# Patient Record
Sex: Female | Born: 1970 | Race: White | Hispanic: No | Marital: Single | Smoking: Never smoker
Health system: Southern US, Community
[De-identification: ages and names within clinical notes are randomized; demographics above are authoritative.]

## PROBLEM LIST (undated history)

## (undated) DIAGNOSIS — N289 Disorder of kidney and ureter, unspecified: Secondary | ICD-10-CM

---

## 2008-11-10 ENCOUNTER — Other Ambulatory Visit: Admission: RE | Admit: 2008-11-10 | Discharge: 2008-11-10 | Payer: Self-pay | Admitting: Family Medicine

## 2010-11-06 ENCOUNTER — Other Ambulatory Visit (HOSPITAL_COMMUNITY)
Admission: RE | Admit: 2010-11-06 | Discharge: 2010-11-06 | Disposition: A | Discharge status: Home / Self Care | Payer: BC Managed Care – PPO | Source: Ambulatory Visit | Attending: Family Medicine | Admitting: Family Medicine

## 2010-11-06 DIAGNOSIS — Z124 Encounter for screening for malignant neoplasm of cervix: Secondary | ICD-10-CM | POA: Insufficient documentation

## 2010-11-06 DIAGNOSIS — Z1159 Encounter for screening for other viral diseases: Secondary | ICD-10-CM | POA: Insufficient documentation

## 2010-12-10 ENCOUNTER — Emergency Department (HOSPITAL_COMMUNITY)
Admission: EM | Admit: 2010-12-10 | Discharge: 2010-12-10 | Disposition: A | Discharge status: Home / Self Care | Payer: BC Managed Care – PPO | Attending: Emergency Medicine | Admitting: Emergency Medicine

## 2010-12-10 ENCOUNTER — Encounter (HOSPITAL_COMMUNITY): Payer: Self-pay

## 2010-12-10 ENCOUNTER — Emergency Department (HOSPITAL_COMMUNITY): Payer: BC Managed Care – PPO

## 2010-12-10 DIAGNOSIS — N200 Calculus of kidney: Secondary | ICD-10-CM | POA: Insufficient documentation

## 2010-12-10 DIAGNOSIS — R1032 Left lower quadrant pain: Secondary | ICD-10-CM | POA: Insufficient documentation

## 2010-12-10 DIAGNOSIS — R112 Nausea with vomiting, unspecified: Secondary | ICD-10-CM | POA: Insufficient documentation

## 2010-12-10 LAB — DIFFERENTIAL
Basophils Absolute: 0.1 10*3/uL (ref 0.0–0.1)
Basophils Relative: 0 % (ref 0–1)
Eosinophils Absolute: 0.6 10*3/uL (ref 0.0–0.7)
Monocytes Absolute: 1.4 10*3/uL — ABNORMAL HIGH (ref 0.1–1.0)
Neutro Abs: 6.7 10*3/uL (ref 1.7–7.7)

## 2010-12-10 LAB — URINALYSIS, ROUTINE W REFLEX MICROSCOPIC
Glucose, UA: NEGATIVE mg/dL
Ketones, ur: NEGATIVE mg/dL
Protein, ur: NEGATIVE mg/dL
pH: 5 (ref 5.0–8.0)

## 2010-12-10 LAB — CBC
Hemoglobin: 13 g/dL (ref 12.0–15.0)
MCH: 29.5 pg (ref 26.0–34.0)
MCHC: 34.4 g/dL (ref 30.0–36.0)
Platelets: 273 10*3/uL (ref 150–400)
RDW: 12.5 % (ref 11.5–15.5)

## 2010-12-10 LAB — COMPREHENSIVE METABOLIC PANEL
ALT: 8 U/L (ref 0–35)
AST: 15 U/L (ref 0–37)
Albumin: 3.8 g/dL (ref 3.5–5.2)
Alkaline Phosphatase: 46 U/L (ref 39–117)
Calcium: 9.6 mg/dL (ref 8.4–10.5)
Glucose, Bld: 135 mg/dL — ABNORMAL HIGH (ref 70–99)
Potassium: 3.5 mEq/L (ref 3.5–5.1)
Sodium: 137 mEq/L (ref 135–145)
Total Protein: 8 g/dL (ref 6.0–8.3)

## 2010-12-10 LAB — URINE MICROSCOPIC-ADD ON

## 2012-07-20 IMAGING — CT CT ABD-PELV W/O CM
2 of 4 series · 17 of 46 positions shown, 19 images · non-contrast
Comparison: None.

CLINICAL DATA: .

CT ABDOMEN AND PELVIS WITHOUT CONTRAST
TECHNIQUE: Multidetector CT imaging of the abdomen and pelvis was
performed following the standard protocol without intravenous
contrast.

[Series 2: under 200# stone no prev · axial · 0.59mm/px · z∈[-257,+73]mm · 14 of 72 slices shown, 16 images]
[im 3/72  soft-tissue]
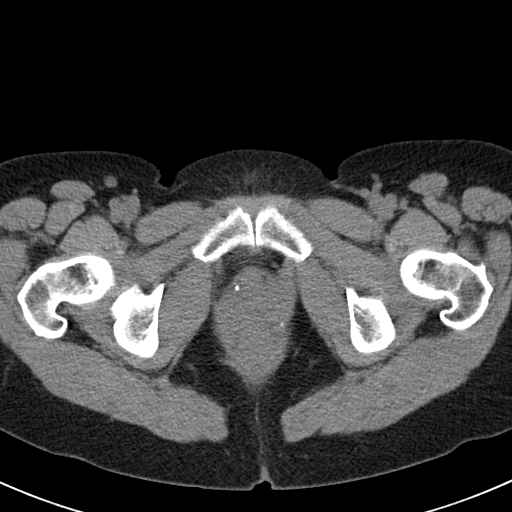
[im 3/72  bone]
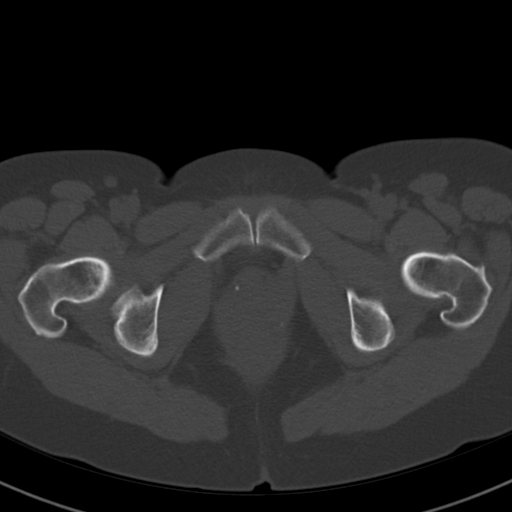
[im 9/72  soft-tissue]
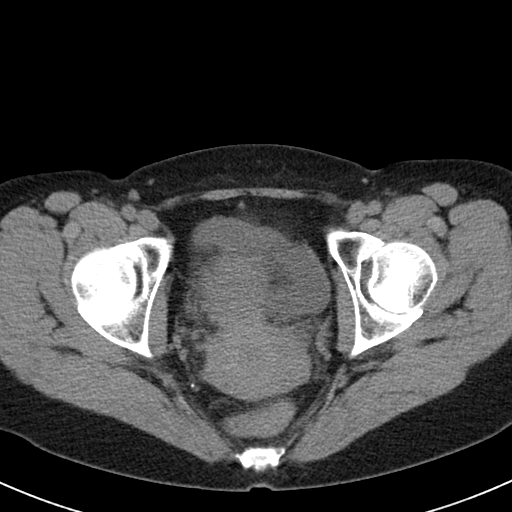
[im 15/72  soft-tissue]
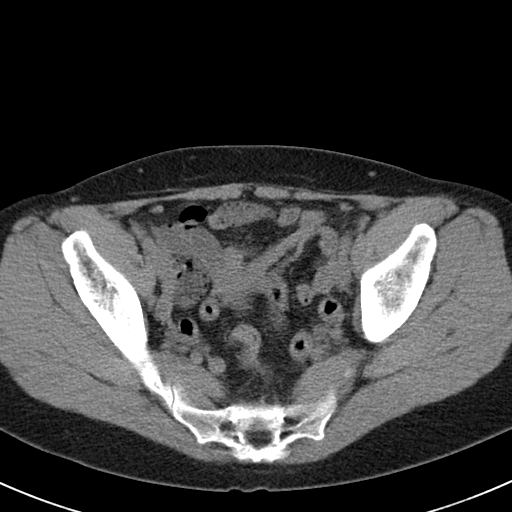
[im 20/72  soft-tissue]
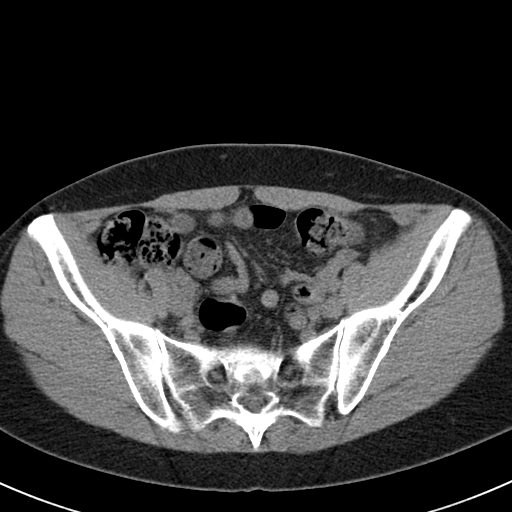
[im 23/72  soft-tissue]
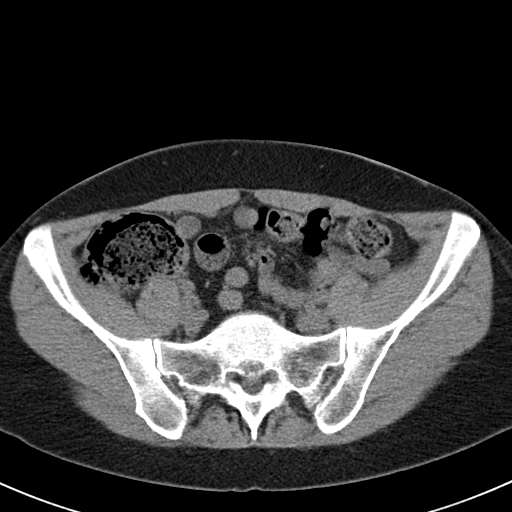
[im 29/72  soft-tissue]
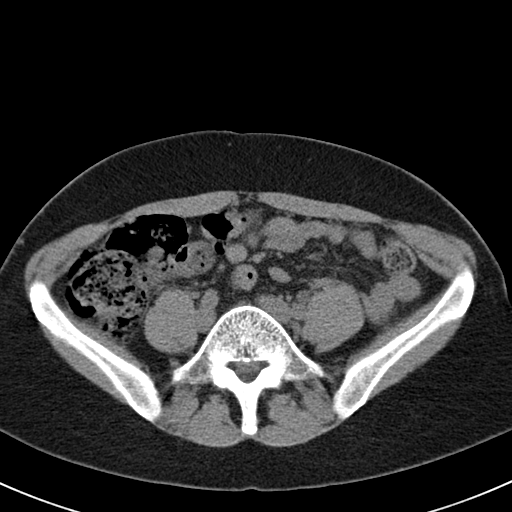
[im 35/72  soft-tissue]
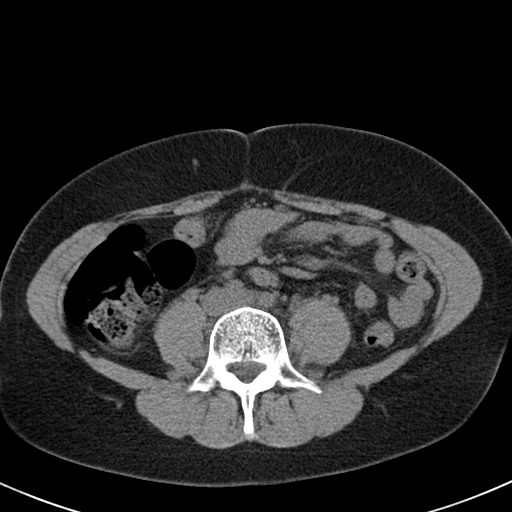
[im 37/72  soft-tissue]
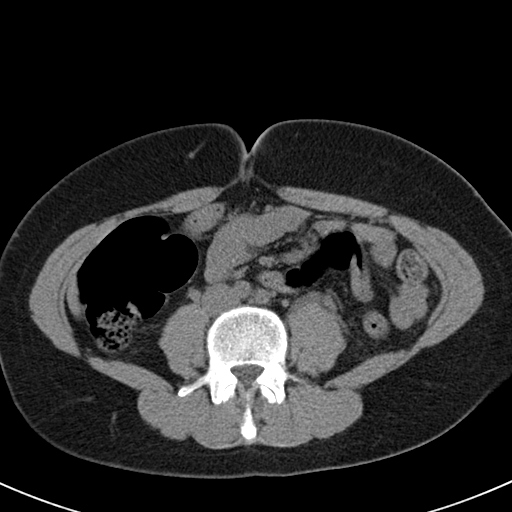
[im 43/72  soft-tissue]
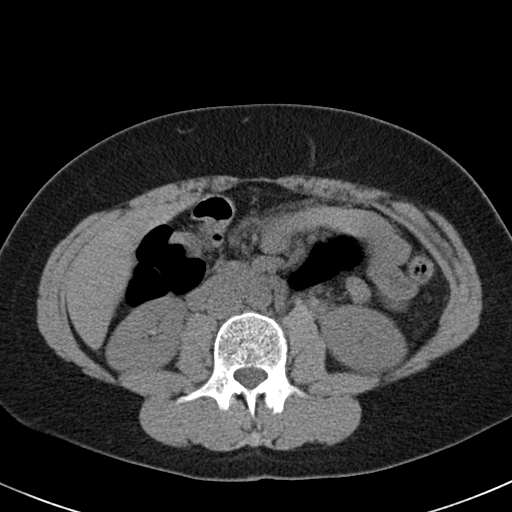
[im 43/72  bone]
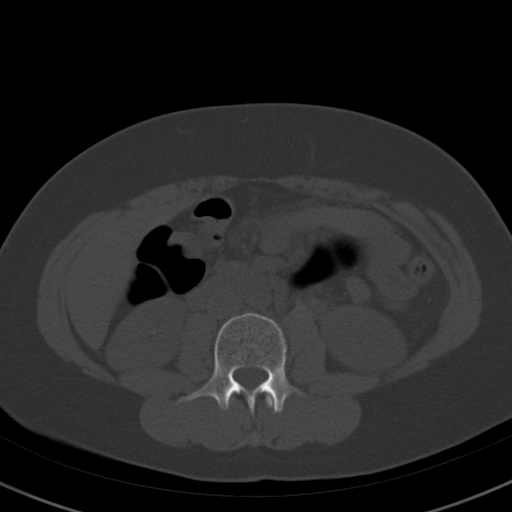
[im 49/72  soft-tissue]
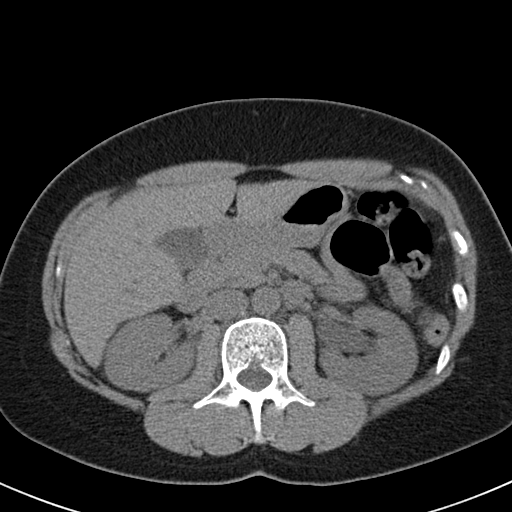
[im 54/72  soft-tissue]
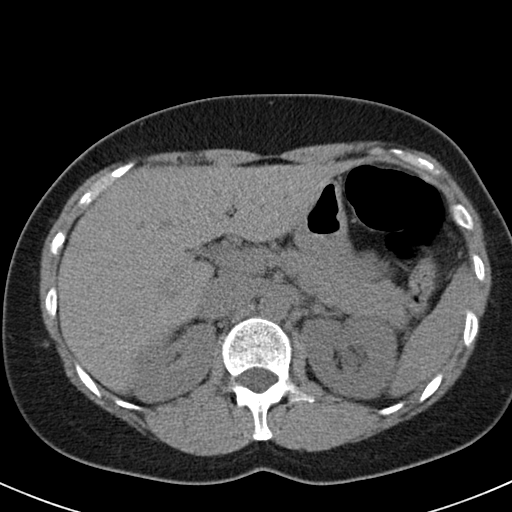
[im 57/72  soft-tissue]
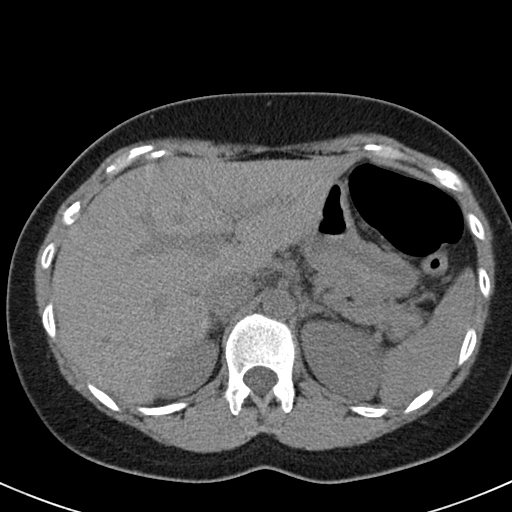
[im 63/72  soft-tissue]
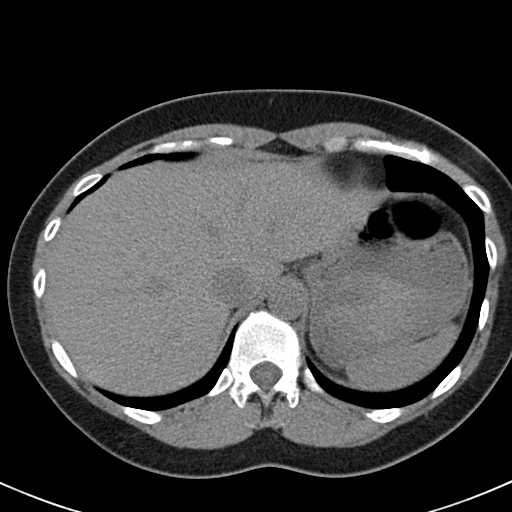
[im 69/72  soft-tissue]
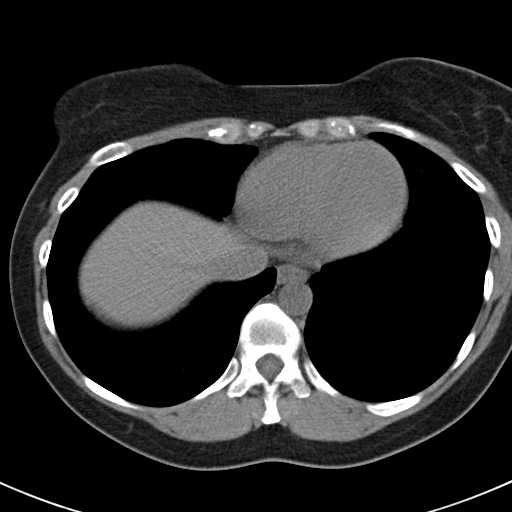

[Series 602: <mpr thick range> · coronal · 0.74mm/px · 3 of 67 slices shown]
[im 23/67  soft-tissue]
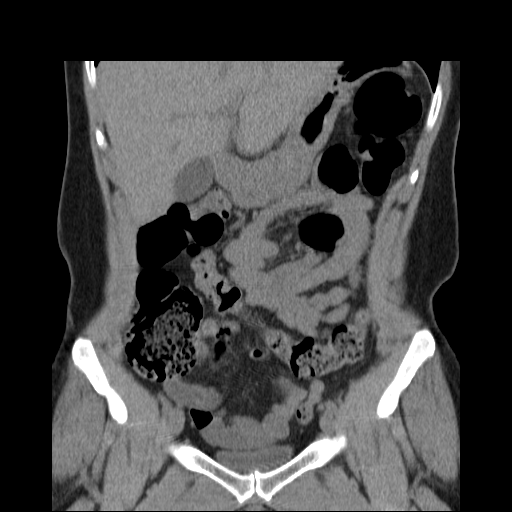
[im 30/67  soft-tissue]
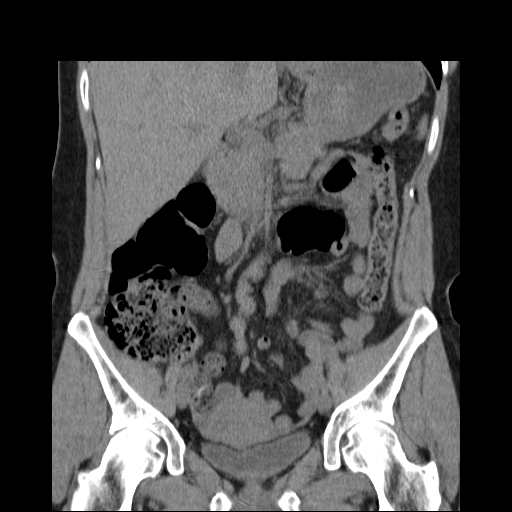
[im 37/67  soft-tissue]
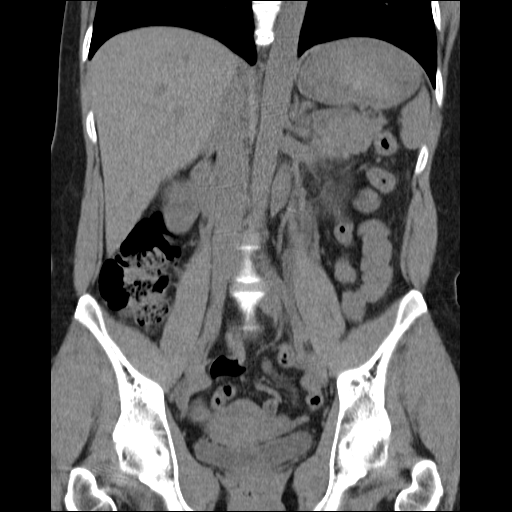

[17 of 46 positions shown; findings below may reference images not displayed]

FINDINGS: Limited images through the lung bases demonstrate no
significant appreciable abnormality. The heart size is within
normal limits. No pleural or pericardial effusion.

Unremarkable liver, biliary system, spleen, pancreas, and adrenal
glands.  There is a nonobstructing tiny right renal stone.  There
is mild left-sided hydroureteronephrosis.  A 2 mm stone is present
at the left UVJ or just distal to the left UVJ.  Otherwise the
bladder is unremarkable.

Bowel loops are of normal course and caliber

Uterus and adnexa are within normal limits.  No free fluid within
the pelvis.

No aggressive osseous lesions. L4-5 disc bulge results in central
canal narrowing.  The aorta is normal in caliber.
IMPRESSION: 2 mm stone at the left UVJ.  Mild left-sided hydroureteronephrosis.

2 mm nonobstructing interpolar right renal stone.

## 2014-01-31 ENCOUNTER — Other Ambulatory Visit: Payer: Self-pay | Admitting: Family Medicine

## 2014-01-31 ENCOUNTER — Other Ambulatory Visit (HOSPITAL_COMMUNITY)
Admission: RE | Admit: 2014-01-31 | Discharge: 2014-01-31 | Disposition: A | Discharge status: Home / Self Care | Payer: BC Managed Care – PPO | Source: Ambulatory Visit | Attending: Family Medicine | Admitting: Family Medicine

## 2014-01-31 DIAGNOSIS — Z1151 Encounter for screening for human papillomavirus (HPV): Secondary | ICD-10-CM | POA: Insufficient documentation

## 2014-01-31 DIAGNOSIS — Z01419 Encounter for gynecological examination (general) (routine) without abnormal findings: Secondary | ICD-10-CM | POA: Diagnosis present

## 2014-02-02 LAB — CYTOLOGY - PAP

## 2016-01-20 ENCOUNTER — Emergency Department (HOSPITAL_COMMUNITY)
Admission: EM | Admit: 2016-01-20 | Discharge: 2016-01-20 | Disposition: A | Discharge status: Home / Self Care | Payer: BC Managed Care – PPO | Attending: Emergency Medicine | Admitting: Emergency Medicine

## 2016-01-20 ENCOUNTER — Encounter (HOSPITAL_COMMUNITY): Payer: Self-pay | Admitting: Emergency Medicine

## 2016-01-20 ENCOUNTER — Emergency Department (HOSPITAL_COMMUNITY): Payer: BC Managed Care – PPO

## 2016-01-20 DIAGNOSIS — N200 Calculus of kidney: Secondary | ICD-10-CM | POA: Diagnosis not present

## 2016-01-20 DIAGNOSIS — Z79899 Other long term (current) drug therapy: Secondary | ICD-10-CM | POA: Diagnosis not present

## 2016-01-20 DIAGNOSIS — Z791 Long term (current) use of non-steroidal anti-inflammatories (NSAID): Secondary | ICD-10-CM | POA: Diagnosis not present

## 2016-01-20 DIAGNOSIS — R109 Unspecified abdominal pain: Secondary | ICD-10-CM | POA: Diagnosis present

## 2016-01-20 HISTORY — DX: Disorder of kidney and ureter, unspecified: N28.9

## 2016-01-20 LAB — I-STAT CHEM 8, ED
BUN: 22 mg/dL — AB (ref 6–20)
CALCIUM ION: 1.08 mmol/L — AB (ref 1.15–1.40)
Chloride: 108 mmol/L (ref 101–111)
Creatinine, Ser: 0.8 mg/dL (ref 0.44–1.00)
GLUCOSE: 132 mg/dL — AB (ref 65–99)
HCT: 43 % (ref 36.0–46.0)
Hemoglobin: 14.6 g/dL (ref 12.0–15.0)
Potassium: 3.7 mmol/L (ref 3.5–5.1)
SODIUM: 139 mmol/L (ref 135–145)
TCO2: 18 mmol/L (ref 0–100)

## 2016-01-20 LAB — URINALYSIS, ROUTINE W REFLEX MICROSCOPIC
BILIRUBIN URINE: NEGATIVE
GLUCOSE, UA: NEGATIVE mg/dL
KETONES UR: 40 mg/dL — AB
NITRITE: NEGATIVE
PH: 5.5 (ref 5.0–8.0)
Protein, ur: 30 mg/dL — AB
Specific Gravity, Urine: 1.036 — ABNORMAL HIGH (ref 1.005–1.030)

## 2016-01-20 LAB — I-STAT BETA HCG BLOOD, ED (MC, WL, AP ONLY): I-stat hCG, quantitative: <5 m[IU]/mL

## 2016-01-20 LAB — URINE MICROSCOPIC-ADD ON

## 2016-01-20 MED ORDER — HYDROCODONE-ACETAMINOPHEN 5-325 MG PO TABS: 1.0000 | ORAL_TABLET | Freq: Four times a day (QID) | ORAL | 0 refills | Status: AC | PRN

## 2016-01-20 MED ORDER — ONDANSETRON 8 MG PO TBDP
8.0000 mg | ORAL_TABLET | Freq: Once | ORAL | Status: AC
  Administered 2016-01-20: 8 mg via ORAL
  Filled 2016-01-20: qty 1

## 2016-01-20 MED ORDER — ONDANSETRON HCL 4 MG/2ML IJ SOLN
4.0000 mg | Freq: Once | INTRAMUSCULAR | Status: AC
  Administered 2016-01-20: 4 mg via INTRAVENOUS
  Filled 2016-01-20: qty 2

## 2016-01-20 MED ORDER — HYDROMORPHONE HCL 1 MG/ML IJ SOLN
1.0000 mg | Freq: Once | INTRAMUSCULAR | Status: AC
  Administered 2016-01-20: 1 mg via INTRAVENOUS
  Filled 2016-01-20: qty 1

## 2016-01-20 MED ORDER — ONDANSETRON 4 MG PO TBDP: 4.0000 mg | ORAL_TABLET | Freq: Three times a day (TID) | ORAL | 0 refills | Status: AC | PRN

## 2016-01-20 MED ORDER — KETOROLAC TROMETHAMINE 30 MG/ML IJ SOLN
15.0000 mg | Freq: Once | INTRAMUSCULAR | Status: AC
  Administered 2016-01-20: 15 mg via INTRAMUSCULAR
  Filled 2016-01-20: qty 1

## 2016-01-20 MED ORDER — TAMSULOSIN HCL 0.4 MG PO CAPS: 0.4000 mg | ORAL_CAPSULE | Freq: Every day | ORAL | 0 refills | Status: AC

## 2016-01-20 MED ORDER — SODIUM CHLORIDE 0.9 % IV BOLUS (SEPSIS)
1000.0000 mL | Freq: Once | INTRAVENOUS | Status: AC
  Administered 2016-01-20: 1000 mL via INTRAVENOUS

## 2016-01-20 NOTE — ED Triage Notes (Signed)
Pt c/o L sided abd pain onset yesterday with radiation to L flank. +n/v

## 2016-01-20 NOTE — ED Notes (Signed)
Patient given Zofran and was wheel chaired to car. Patient nausea subsided

## 2016-01-20 NOTE — ED Provider Notes (Signed)
[facility]-EMERGENCY DEPT Provider Note   CSN: [id number] Arrival date & time: 01/20/16  0402     History   Chief Complaint Chief Complaint  Patient presents with  . Abdominal Pain    HPI Jill Best is a 45 y.o. female.  HPI Patient presents with the acute onset of left-sided abdominal flank pain. Pain began within the past few hours, since onset has been persistent, severe, minimally improved with home narcotic use. There is associated nausea, vomiting, no diarrhea. No dysuria. Patient states that the pain is the same as that she experienced with prior kidney stone. She was well prior to the onset of this illness.  Past Medical History:  Diagnosis Date  . Renal disorder    kidney stones    There are no active problems to display for this patient.   Past Surgical History:  Procedure Laterality Date  . CESAREAN SECTION      OB History    No data available       Home Medications    Prior to Admission medications   Not on File    Family History No family history on file.  Social History Social History  Substance Use Topics  . Smoking status: Never Smoker  . Smokeless tobacco: Never Used  . Alcohol use No     Allergies   Review of patient's allergies indicates no known allergies.   Review of Systems Review of Systems  Constitutional:       Per HPI, otherwise negative  HENT:       Per HPI, otherwise negative  Respiratory:       Per HPI, otherwise negative  Cardiovascular:       Per HPI, otherwise negative  Gastrointestinal: Positive for abdominal pain, nausea and vomiting.  Endocrine:       Negative aside from HPI  Genitourinary:       Neg aside from HPI   Musculoskeletal:       Per HPI, otherwise negative  Skin: Negative.   Neurological: Negative for syncope.     Physical Exam Updated Vital Signs BP (!) 172/105 (BP Location: Left Arm)   Pulse 68   Temp 97.8 F (36.6 C) (Oral)   Resp 22   Ht 5\' 2"  (1.575 m)   Wt 125 lb (56.7  kg)   LMP 01/15/2016   SpO2 100%   BMI 22.86 kg/m   Physical Exam  Constitutional: She is oriented to person, place, and time. She appears well-developed and well-nourished. No distress.  Uncomfortable appearing female resting in a fetal position on her knees on the gurney  HENT:  Head: Normocephalic and atraumatic.  Eyes: Conjunctivae and EOM are normal.  Cardiovascular: Normal rate and regular rhythm.   Pulmonary/Chest: Effort normal and breath sounds normal. No stridor. No respiratory distress.  Abdominal: She exhibits no distension. There is no tenderness.  Patient describes flank pain, abdominal pain  Musculoskeletal: She exhibits no edema.  Neurological: She is alert and oriented to person, place, and time. No cranial nerve deficit.  Skin: Skin is warm and dry.  Psychiatric: She has a normal mood and affect.  Nursing note and vitals reviewed.    ED Treatments / Results  Labs (all labs ordered are listed, but only abnormal results are displayed) Labs Reviewed  URINALYSIS, ROUTINE W REFLEX MICROSCOPIC (NOT AT [facility]) - Abnormal; Notable for the following:       Result Value   APPearance CLOUDY (*)    Specific Gravity, Urine 1.036 (*)  Hgb urine dipstick LARGE (*)    Ketones, ur 40 (*)    Protein, ur 30 (*)    Leukocytes, UA SMALL (*)    All other components within normal limits  URINE MICROSCOPIC-ADD ON - Abnormal; Notable for the following:    Squamous Epithelial / LPF 6-30 (*)    Bacteria, UA FEW (*)    All other components within normal limits  I-STAT CHEM 8, ED - Abnormal; Notable for the following:    BUN 22 (*)    Glucose, Bld 132 (*)    Calcium, Ion 1.08 (*)    All other components within normal limits  I-STAT BETA HCG BLOOD, ED ([facility], [facility], [facility] ONLY)    Radiology [place] Renal  Result Date: 01/20/2016 CLINICAL DATA:  Acute onset of left flank pain, with nausea and vomiting. Initial encounter. EXAM: RENAL / URINARY TRACT ULTRASOUND COMPLETE COMPARISON:  CT of the  abdomen and pelvis from 12/10/2010 FINDINGS: Right Kidney: Length: 10.5 cm. Echogenicity within normal limits. No mass or hydronephrosis visualized. Left Kidney: Length: 12.2 cm. Echogenicity within normal limits. No mass or hydronephrosis visualized. Bladder: Decompressed and not well assessed. IMPRESSION: Unremarkable renal ultrasound. Electronically Signed   By: Roanna RaiderJeffery  Chang M.D.   On: 01/20/2016 06:03    Procedures Procedures (including critical care time)  Medications Ordered in ED Medications  ketorolac (TORADOL) 30 MG/ML injection 15 mg (15 mg Intramuscular Given 01/20/16 0444)  sodium chloride 0.9 % bolus 1,000 mL (0 mLs Intravenous Stopped 01/20/16 0644)  ondansetron (ZOFRAN) injection 4 mg (4 mg Intravenous Given 01/20/16 0609)  HYDROmorphone (DILAUDID) injection 1 mg (1 mg Intravenous Given 01/20/16 0609)   Update: Patient substantially better, lying in a supine fashion, speaking clearly, with only soreness in the left lower quadrant, no substantial pain.  We discussed results thus far, consideration of CT scan for further delineation of the stone dimensions. Patient will follow-up with urology directly.   Initial Impression / Assessment and Plan / ED Course  I have reviewed the triage vital signs and the nursing notes.  Pertinent labs & imaging results that were available during my care of the patient were reviewed by me and considered in my medical decision making (see chart for details).   Patient with history of kidney stones presents with acute onset left flank pain, has physical exam, urinalysis, consistent with kidney stone. Here, no evidence for hydronephrosis, urinary tract infection and symptoms were well-controlled with fluids, antiemetic, analgesia. Patient appropriate for discharge with outpatient urology follow-up.   Gerhard Munchobert Rosealee Recinos, MD 01/20/16 [phone]

## 2016-01-20 NOTE — ED Notes (Signed)
Patient felt nauseas going out. Got ODT zofran

## 2016-06-12 ENCOUNTER — Other Ambulatory Visit: Payer: Self-pay | Admitting: Family Medicine

## 2016-06-12 ENCOUNTER — Other Ambulatory Visit (HOSPITAL_COMMUNITY)
Admission: RE | Admit: 2016-06-12 | Discharge: 2016-06-12 | Disposition: A | Discharge status: Home / Self Care | Payer: BC Managed Care – PPO | Source: Ambulatory Visit | Attending: Family Medicine | Admitting: Family Medicine

## 2016-06-12 DIAGNOSIS — Z124 Encounter for screening for malignant neoplasm of cervix: Secondary | ICD-10-CM | POA: Insufficient documentation

## 2016-06-12 DIAGNOSIS — Z1151 Encounter for screening for human papillomavirus (HPV): Secondary | ICD-10-CM | POA: Insufficient documentation

## 2016-06-13 LAB — CYTOLOGY - PAP
DIAGNOSIS: NEGATIVE
HPV: NOT DETECTED

## 2017-12-12 IMAGING — US US RENAL
1 series · 14 of 25 positions shown · non-contrast
Comparison: CT of the abdomen and pelvis from 12/10/2010

CLINICAL DATA: Acute onset of left flank pain, with nausea and
vomiting. Initial encounter.

EXAM:
RENAL / URINARY TRACT ULTRASOUND COMPLETE

[Series 1: us renal · 0.23mm/px · 14 of 56 slices shown]
[im 1/56]
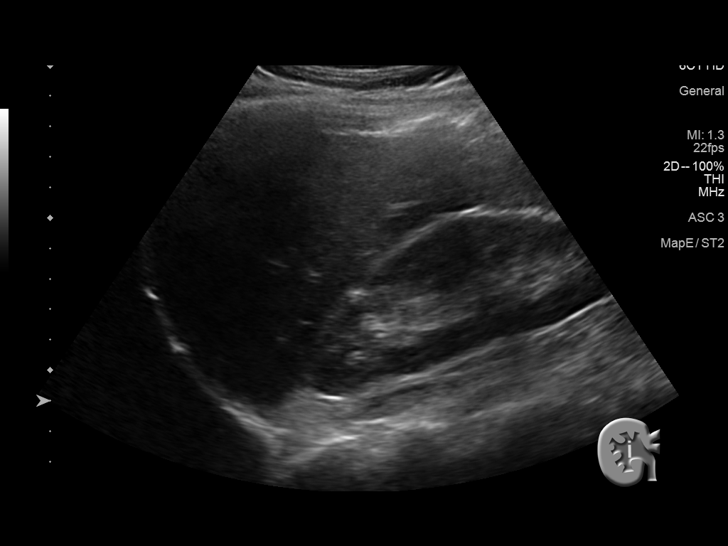
[im 5/56]
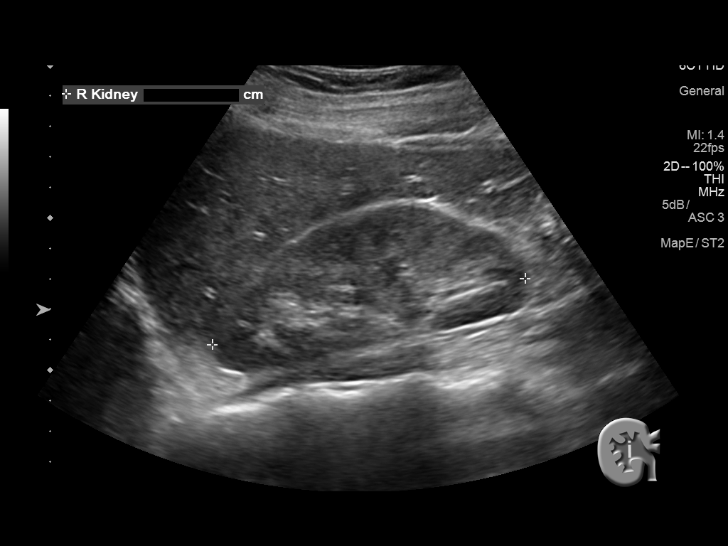
[im 10/56]
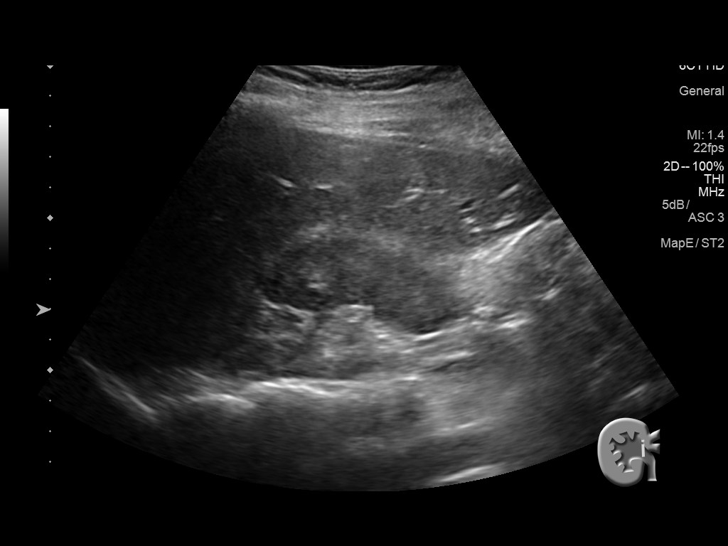
[im 14/56]
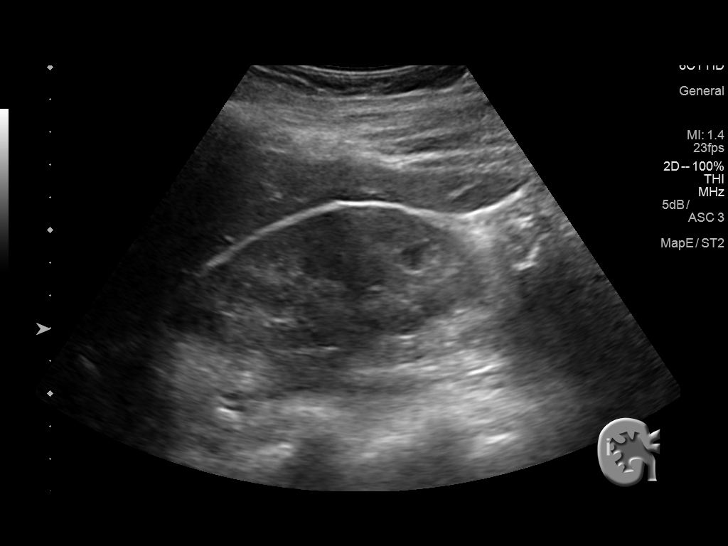
[im 19/56]
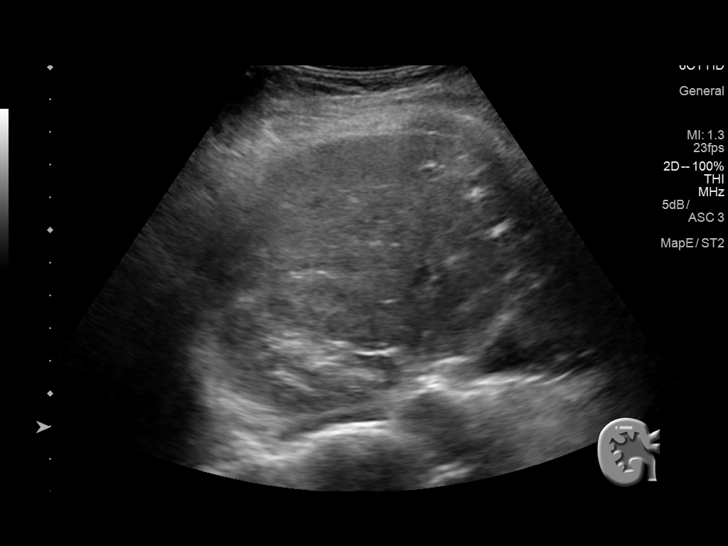
[im 21/56]
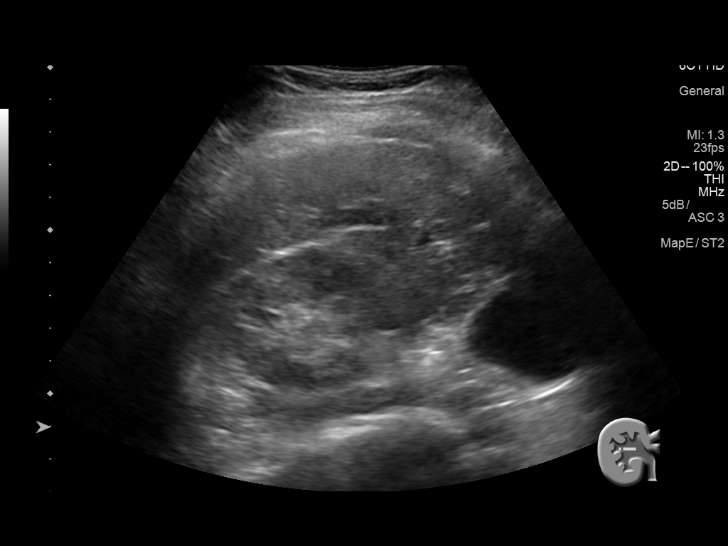
[im 26/56]
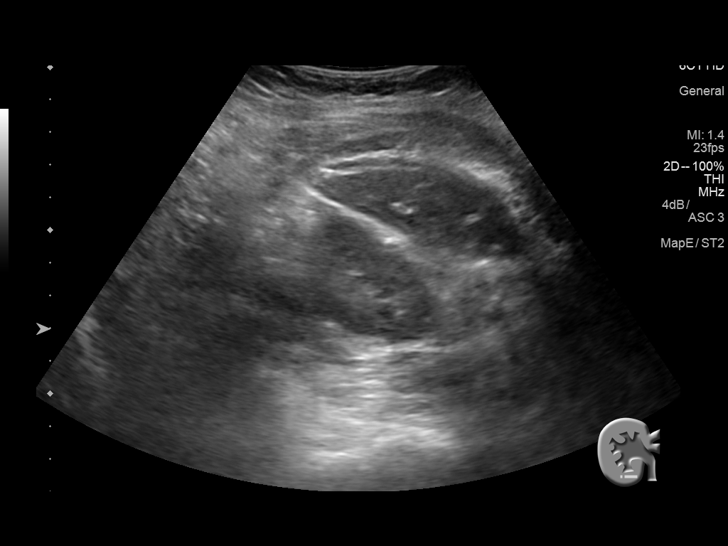
[im 30/56]
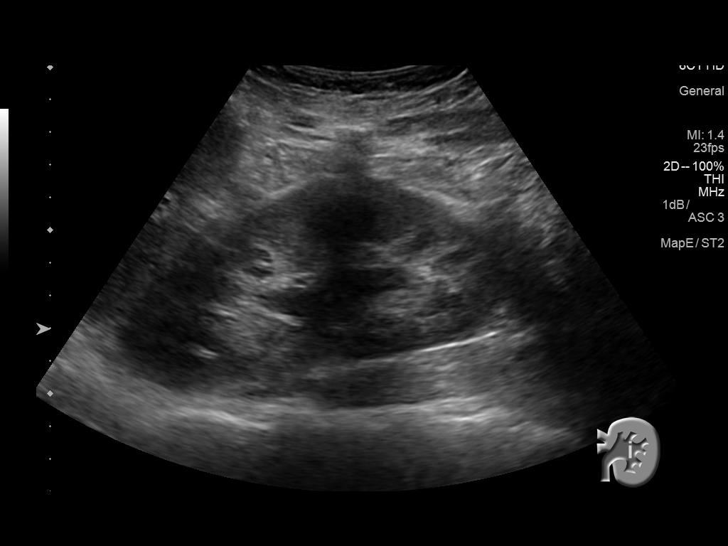
[im 35/56]
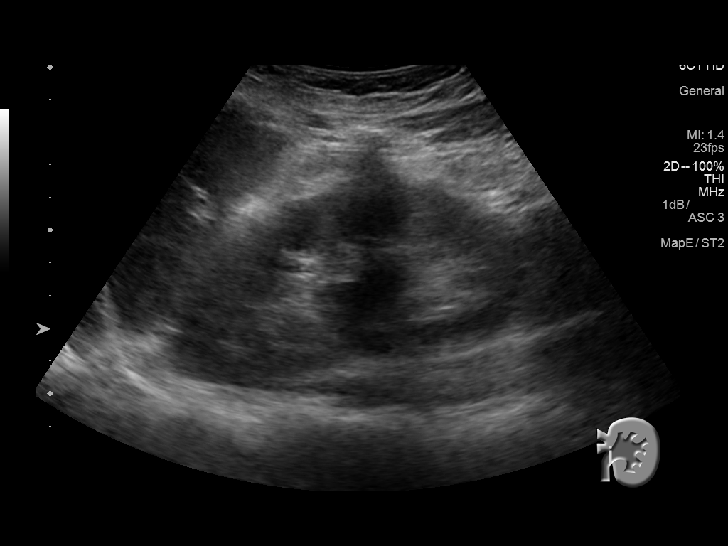
[im 37/56]
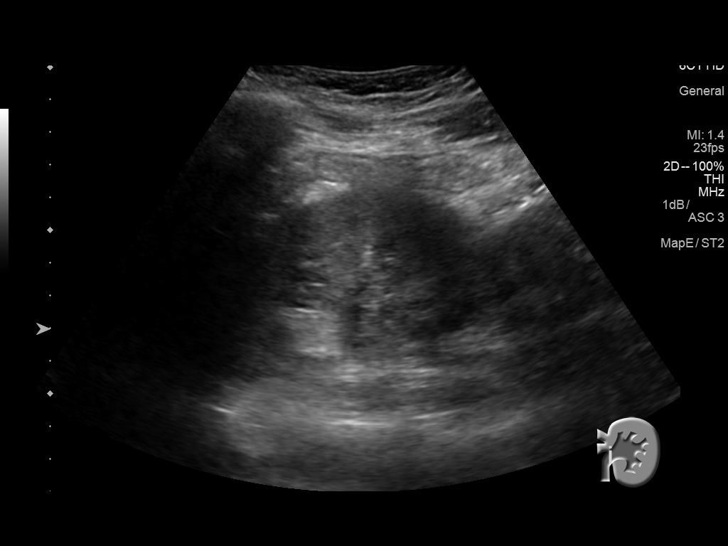
[im 42/56]
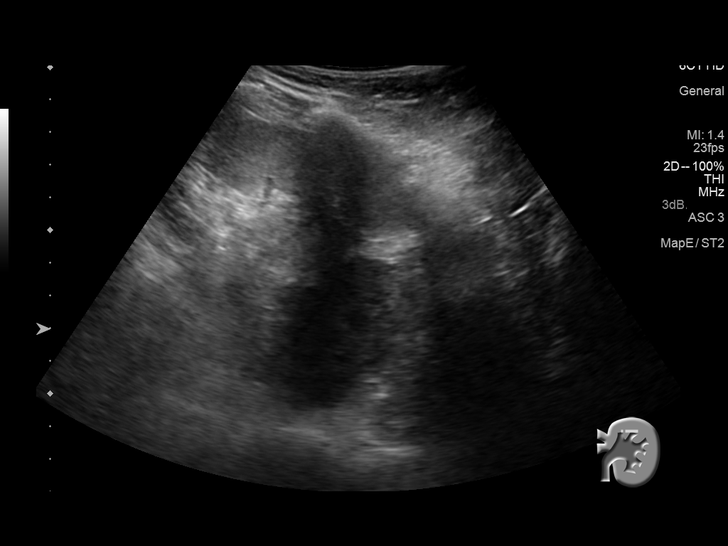
[im 46/56]
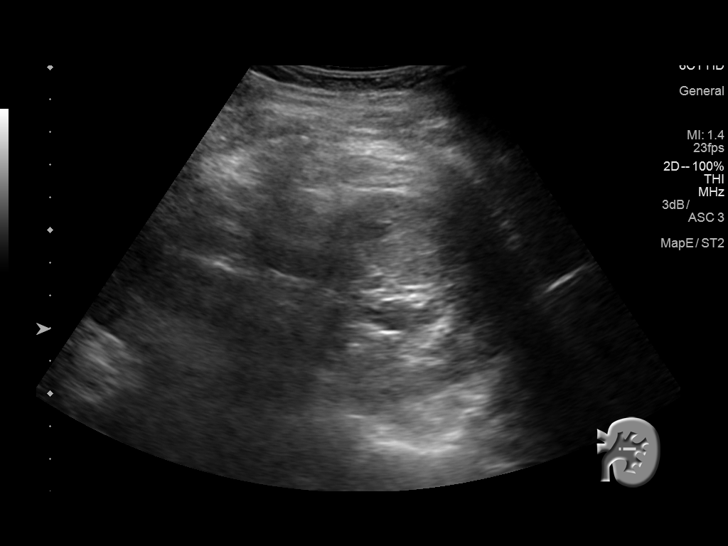
[im 51/56]
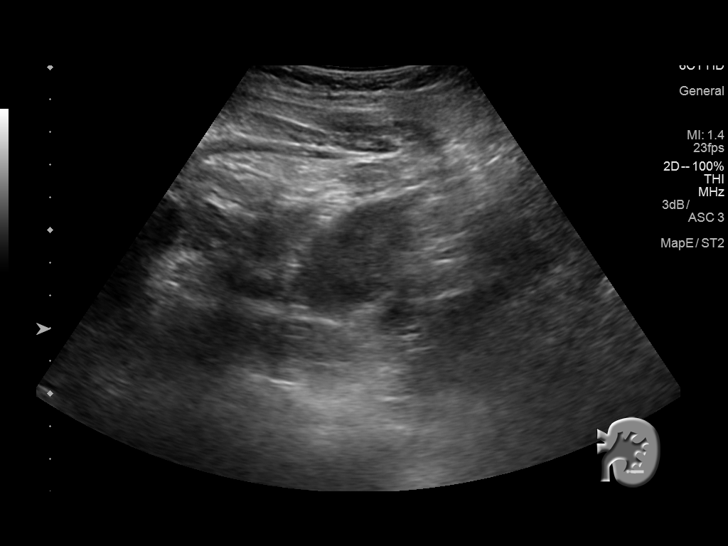
[im 56/56]
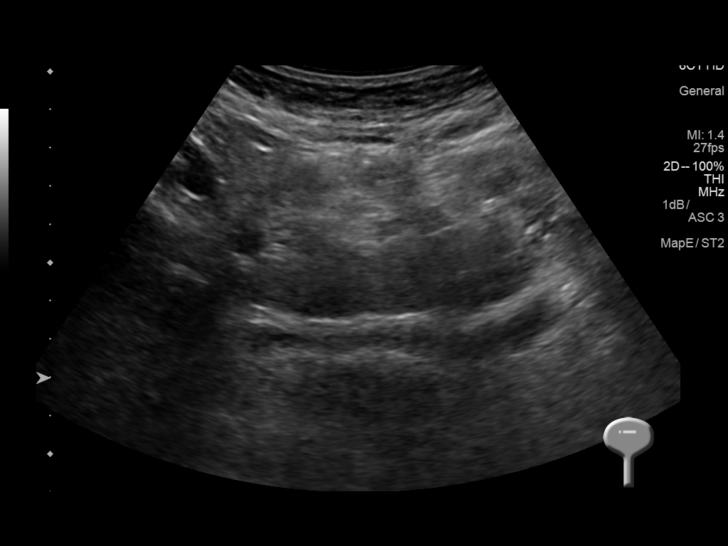

[14 of 25 positions shown; findings below may reference images not displayed]

FINDINGS: Right Kidney:

Length: 10.5 cm. Echogenicity within normal limits. No mass or
hydronephrosis visualized.

Left Kidney:

Length: 12.2 cm. Echogenicity within normal limits. No mass or
hydronephrosis visualized.

Bladder:

Decompressed and not well assessed.
IMPRESSION: Unremarkable renal ultrasound.

## 2022-09-06 ENCOUNTER — Emergency Department (HOSPITAL_BASED_OUTPATIENT_CLINIC_OR_DEPARTMENT_OTHER)
Admission: EM | Admit: 2022-09-06 | Discharge: 2022-09-06 | Disposition: A | Discharge status: Home / Self Care | Payer: BC Managed Care – PPO | Attending: Emergency Medicine | Admitting: Emergency Medicine

## 2022-09-06 ENCOUNTER — Encounter (HOSPITAL_BASED_OUTPATIENT_CLINIC_OR_DEPARTMENT_OTHER): Payer: Self-pay

## 2022-09-06 ENCOUNTER — Other Ambulatory Visit: Payer: Self-pay

## 2022-09-06 ENCOUNTER — Emergency Department (HOSPITAL_BASED_OUTPATIENT_CLINIC_OR_DEPARTMENT_OTHER): Payer: BC Managed Care – PPO | Admitting: Radiology

## 2022-09-06 DIAGNOSIS — R079 Chest pain, unspecified: Secondary | ICD-10-CM | POA: Diagnosis present

## 2022-09-06 LAB — CBC
HCT: 41.5 % (ref 36.0–46.0)
Hemoglobin: 13.9 g/dL (ref 12.0–15.0)
MCH: 29.2 pg (ref 26.0–34.0)
MCHC: 33.5 g/dL (ref 30.0–36.0)
MCV: 87.2 fL (ref 80.0–100.0)
Platelets: 305 10*3/uL (ref 150–400)
RBC: 4.76 MIL/uL (ref 3.87–5.11)
RDW: 12.7 % (ref 11.5–15.5)
WBC: 7.9 10*3/uL (ref 4.0–10.5)
nRBC: 0 % (ref 0.0–0.2)

## 2022-09-06 LAB — BASIC METABOLIC PANEL
Anion gap: 10 (ref 5–15)
BUN: 16 mg/dL (ref 6–20)
CO2: 24 mmol/L (ref 22–32)
Calcium: 9.2 mg/dL (ref 8.9–10.3)
Chloride: 104 mmol/L (ref 98–111)
Creatinine, Ser: 0.75 mg/dL (ref 0.44–1.00)
GFR, Estimated: >60 mL/min (ref >60)
Glucose, Bld: 98 mg/dL (ref 70–99)
Potassium: 3.9 mmol/L (ref 3.5–5.1)
Sodium: 138 mmol/L (ref 135–145)

## 2022-09-06 LAB — TROPONIN I (HIGH SENSITIVITY)
Troponin I (High Sensitivity): 4 ng/L (ref <18)
Troponin I (High Sensitivity): 7 ng/L (ref <18)

## 2022-09-06 NOTE — ED Notes (Signed)
Pt aware of the need for a urine... Unable to currently collect.... 

## 2022-09-06 NOTE — Discharge Instructions (Addendum)
Return if any problems.  Take an 81 mg aspirin daily

## 2022-09-06 NOTE — ED Notes (Signed)
Discharge paperwork given and verbally understood. 

## 2022-09-06 NOTE — ED Triage Notes (Signed)
Pt c/o neck pain, numbness in jaw, onset yesterday "but I didn't know what was going on so I just wrote it off." States that she was seen at PCP before [place] for centralized CP/ pressure, advised she was "fine, but she only listened to my heart." States that pain has since begun radiating up into neck & jaw. States people kept telling her "it's heartburn, but I don't think it's heartburn." Denies known cardiac/ respiratory hx.

## 2022-09-08 NOTE — ED Provider Notes (Signed)
[place] EMERGENCY DEPARTMENT AT [facility] Provider Note   CSN: [id number] Arrival date & time: 09/06/22  1532     History  Chief Complaint  Patient presents with   Chest Pain   Neck Pain    Jill Best is a 52 y.o. female.  The history is provided by the patient. No language interpreter was used.  Chest Pain Pain location:  L chest Pain quality: aching   Pain radiates to:  Does not radiate Duration:  1 month Timing:  Constant Chronicity:  New Relieved by:  Nothing Worsened by:  Nothing Ineffective treatments:  None tried Associated symptoms: no abdominal pain   Risk factors: no high cholesterol and no hypertension   Neck Pain Associated symptoms: chest pain        Home Medications Prior to Admission medications   Medication Sig Start Date End Date Taking? Authorizing Provider  BIOTIN PO Take 1 tablet by mouth daily.    [provider]  DASETTA 7/7/7 0.5/0.75/1-35 MG-MCG tablet Take 1 tablet by mouth daily. 12/22/15   [provider]  HYDROcodone-acetaminophen (NORCO/VICODIN) 5-325 MG tablet Take 1 tablet by mouth every 6 (six) hours as needed for severe pain. 01/20/16   Gerhard Munch, MD  ibuprofen (ADVIL,MOTRIN) 200 MG tablet Take 400 mg by mouth every 6 (six) hours as needed (for pain).    [provider]  ondansetron (ZOFRAN ODT) 4 MG disintegrating tablet Take 1 tablet (4 mg total) by mouth every 8 (eight) hours as needed for nausea or vomiting. 01/20/16   Gerhard Munch, MD  tamsulosin (FLOMAX) 0.4 MG CAPS capsule Take 1 capsule (0.4 mg total) by mouth daily. 01/20/16   Gerhard Munch, MD      Allergies    Patient has no known allergies.    Review of Systems   Review of Systems  Cardiovascular:  Positive for chest pain.  Gastrointestinal:  Negative for abdominal pain.  Musculoskeletal:  Positive for neck pain.  All other systems reviewed and are negative.   Physical Exam Updated Vital Signs BP (!) 142/91    Pulse (!) 57   Temp 98.4 F (36.9 C)   Resp 15   SpO2 98%  Physical Exam Vitals and nursing note reviewed.  Constitutional:      Appearance: She is well-developed.  HENT:     Head: Normocephalic.  Cardiovascular:     Rate and Rhythm: Normal rate and regular rhythm.     Heart sounds: Normal heart sounds.  Pulmonary:     Effort: Pulmonary effort is normal.  Abdominal:     General: There is no distension.     Palpations: Abdomen is soft.  Musculoskeletal:        General: Normal range of motion.     Cervical back: Normal range of motion.  Skin:    General: Skin is warm.  Neurological:     General: No focal deficit present.     Mental Status: She is alert and oriented to person, place, and time.     ED Results / Procedures / Treatments   Labs (all labs ordered are listed, but only abnormal results are displayed) Labs Reviewed  BASIC METABOLIC PANEL  CBC  TROPONIN I (HIGH SENSITIVITY)  TROPONIN I (HIGH SENSITIVITY)    EKG EKG Interpretation  Date/Time:  Friday September 06 2022 15:45:13 EDT Ventricular Rate:  78 PR Interval:  136 QRS Duration: 76 QT Interval:  376 QTC Calculation: 428 R Axis:   -5 Text Interpretation: Normal sinus  rhythm Possible Anterior infarct , age undetermined Abnormal ECG No previous ECGs available Confirmed by Richardean Canal ([id number]) on 09/07/2022 11:05:00 PM  Radiology DG Chest 2 View  Result Date: 09/06/2022 CLINICAL DATA:  Chest pain. EXAM: CHEST - 2 VIEW COMPARISON:  None Available. FINDINGS: The heart size and mediastinal contours are within normal limits. Both lungs are clear. The visualized skeletal structures are unremarkable. IMPRESSION: No active cardiopulmonary disease. Electronically Signed   By: Lupita Raider M.D.   On: 09/06/2022 16:13    Procedures Procedures    Medications Ordered in ED Medications - No data to display  ED Course/ Medical Decision Making/ A&P                             Medical Decision Making Pt reports  intermittent episodes of chest pain.    Amount and/or Complexity of Data Reviewed Independent Historian: parent Labs: ordered. Decision-making details documented in ED Course.    Details: Labs ordered reviewed and interpreted.  Troponin is negative x 2.  Radiology: ordered and independent interpretation performed. Decision-making details documented in ED Course.    Details: Chest xray ordered reviewed and interpreted  ECG/medicine tests: ordered and independent interpretation performed. Decision-making details documented in ED Course.    Details: EKg no acute abnormality   Risk Risk Details: I discussed pain with pt.  Pt thinks it may be muscular.  I have advised her to follow up with cardiology.  Referral placed           Final Clinical Impression(s) / ED Diagnoses Final diagnoses:  Chest pain, unspecified type    Rx / DC Orders ED Discharge Orders          Ordered    Ambulatory referral to Cardiology       Comments: If you have not heard from the Cardiology office within the next 72 hours please call [phone].   09/06/22 1914           An After Visit Summary was printed and given to the patient.    Elson Areas, PA-C 09/08/22 Dow Adolph, MD 09/09/22 [phone]

## 2024-01-27 ENCOUNTER — Other Ambulatory Visit: Payer: Self-pay | Admitting: Obstetrics and Gynecology

## 2024-01-27 DIAGNOSIS — R928 Other abnormal and inconclusive findings on diagnostic imaging of breast: Secondary | ICD-10-CM

## 2024-02-04 ENCOUNTER — Ambulatory Visit
Admission: RE | Admit: 2024-02-04 | Discharge: 2024-02-04 | Disposition: A | Discharge status: Home / Self Care | Payer: Self-pay | Source: Ambulatory Visit | Attending: Obstetrics and Gynecology

## 2024-02-04 ENCOUNTER — Ambulatory Visit

## 2024-02-04 ENCOUNTER — Other Ambulatory Visit: Payer: Self-pay | Admitting: Obstetrics and Gynecology

## 2024-02-04 DIAGNOSIS — R928 Other abnormal and inconclusive findings on diagnostic imaging of breast: Secondary | ICD-10-CM

## 2024-02-04 DIAGNOSIS — R921 Mammographic calcification found on diagnostic imaging of breast: Secondary | ICD-10-CM

## 2024-05-28 ENCOUNTER — Emergency Department (HOSPITAL_BASED_OUTPATIENT_CLINIC_OR_DEPARTMENT_OTHER)
Admission: EM | Admit: 2024-05-28 | Discharge: 2024-05-28 | Disposition: A | Discharge status: Home / Self Care | Attending: Emergency Medicine | Admitting: Emergency Medicine

## 2024-05-28 ENCOUNTER — Emergency Department (HOSPITAL_BASED_OUTPATIENT_CLINIC_OR_DEPARTMENT_OTHER)

## 2024-05-28 ENCOUNTER — Emergency Department (HOSPITAL_BASED_OUTPATIENT_CLINIC_OR_DEPARTMENT_OTHER): Admitting: Radiology

## 2024-05-28 DIAGNOSIS — M79622 Pain in left upper arm: Secondary | ICD-10-CM | POA: Insufficient documentation

## 2024-05-28 DIAGNOSIS — J45909 Unspecified asthma, uncomplicated: Secondary | ICD-10-CM | POA: Diagnosis not present

## 2024-05-28 DIAGNOSIS — M79602 Pain in left arm: Secondary | ICD-10-CM

## 2024-05-28 DIAGNOSIS — M542 Cervicalgia: Secondary | ICD-10-CM | POA: Insufficient documentation

## 2024-05-28 DIAGNOSIS — R519 Headache, unspecified: Secondary | ICD-10-CM | POA: Diagnosis not present

## 2024-05-28 LAB — CBC WITH DIFFERENTIAL/PLATELET
Abs Immature Granulocytes: 0.03 K/uL (ref 0.00–0.07)
Basophils Absolute: 0 K/uL (ref 0.0–0.1)
Basophils Relative: 0 %
Eosinophils Absolute: 0.2 K/uL (ref 0.0–0.5)
Eosinophils Relative: 2 %
HCT: 40.4 % (ref 36.0–46.0)
Hemoglobin: 13.5 g/dL (ref 12.0–15.0)
Immature Granulocytes: 0 %
Lymphocytes Relative: 22 %
Lymphs Abs: 2.3 K/uL (ref 0.7–4.0)
MCH: 29 pg (ref 26.0–34.0)
MCHC: 33.4 g/dL (ref 30.0–36.0)
MCV: 86.7 fL (ref 80.0–100.0)
Monocytes Absolute: 0.7 K/uL (ref 0.1–1.0)
Monocytes Relative: 6 %
Neutro Abs: 7.5 K/uL (ref 1.7–7.7)
Neutrophils Relative %: 70 %
Platelets: 337 K/uL (ref 150–400)
RBC: 4.66 MIL/uL (ref 3.87–5.11)
RDW: 12.8 % (ref 11.5–15.5)
WBC: 10.7 K/uL — ABNORMAL HIGH (ref 4.0–10.5)
nRBC: 0 % (ref 0.0–0.2)

## 2024-05-28 LAB — BASIC METABOLIC PANEL WITH GFR
Anion gap: 13 (ref 5–15)
BUN: 8 mg/dL (ref 6–20)
CO2: 24 mmol/L (ref 22–32)
Calcium: 9.5 mg/dL (ref 8.9–10.3)
Chloride: 102 mmol/L (ref 98–111)
Creatinine, Ser: 0.73 mg/dL (ref 0.44–1.00)
GFR, Estimated: >60 mL/min
Glucose, Bld: 105 mg/dL — ABNORMAL HIGH (ref 70–99)
Potassium: 3.2 mmol/L — ABNORMAL LOW (ref 3.5–5.1)
Sodium: 139 mmol/L (ref 135–145)

## 2024-05-28 LAB — TROPONIN T, HIGH SENSITIVITY: Troponin T High Sensitivity: <15 ng/L (ref 0–19)

## 2024-05-28 MED ORDER — DIAZEPAM 5 MG/ML IJ SOLN
2.5000 mg | Freq: Once | INTRAMUSCULAR | Status: DC
  Filled 2024-05-28: qty 2

## 2024-05-28 MED ORDER — DIAZEPAM 5 MG PO TABS: 2.5000 mg | ORAL_TABLET | ORAL | 0 refills | Status: AC | PRN

## 2024-05-28 MED ORDER — LIDOCAINE 5 % EX PTCH
1.0000 | MEDICATED_PATCH | CUTANEOUS | Status: DC
  Administered 2024-05-28: 1 via TRANSDERMAL
  Filled 2024-05-28: qty 1

## 2024-05-28 MED ORDER — LIDOCAINE 5 % EX PTCH: 1.0000 | MEDICATED_PATCH | CUTANEOUS | 0 refills | Status: AC

## 2024-05-28 NOTE — ED Triage Notes (Signed)
 Reports fatigue and headaches since Christmas. Reports intermittent CP for several weeks- into left face- numbness in face x1 week. No other neuro deficits. Denies tooth infection.

## 2024-05-28 NOTE — ED Notes (Signed)
 Reviewed discharge instructions, medications, and home care with pt. Pt verbalized understanding and had no further questions. Pt exited ED without complications.

## 2024-05-28 NOTE — ED Provider Notes (Signed)
 " [place] [facility] DEPARTMENT AT [facility] Provider Note   CSN: [id number] Arrival date & time: 05/28/24  1605     Patient presents with: Facial Pain   Jill Best is a 54 y.o. female with past medical history of asthma, who presents emergency department for evaluation of multiple complaints.  Patient reports that initially she began experiencing a dull headache the week before Christmas.  That she reports overall not feeling well.  She also reports increased stress over the last few weeks.  She states that she has continued to have a headache since Christmas, and it worsens with intensity as well as activity.  She describes pain in the back of her neck, the side of her neck as well as in her ear and her face.  She states she feels like her face is swollen.  She denies any recent dental infections.  Patient states that she did see her PCP for this complaint as well as go to urgent care, both of which she had a negative workup.  Additionally, patient states that she came to the emergency department yesterday for palpitations and a racing heart, but ultimately left after sitting in the parking lot.   HPI     Prior to Admission medications  Medication Sig Start Date End Date Taking? Authorizing Provider  diazepam  (VALIUM ) 5 MG tablet Take 0.5 tablets (2.5 mg total) by mouth as needed for muscle spasms. 05/28/24  Yes Amillia Biffle, Marry RAMAN, PA-C  lidocaine  (LIDODERM ) 5 % Place 1 patch onto the skin daily. Remove & Discard patch within 12 hours or as directed by MD 05/28/24  Yes Keneisha Heckart, Marry RAMAN, PA-C  BIOTIN PO Take 1 tablet by mouth daily.    [provider]  DASETTA 7/7/7 0.5/0.75/1-35 MG-MCG tablet Take 1 tablet by mouth daily. 12/22/15   [provider]  HYDROcodone -acetaminophen  (NORCO/VICODIN) 5-325 MG tablet Take 1 tablet by mouth every 6 (six) hours as needed for severe pain. 01/20/16   Garrick Charleston, MD  ibuprofen (ADVIL,MOTRIN) 200 MG tablet Take 400 mg  by mouth every 6 (six) hours as needed (for pain).    [provider]  ondansetron  (ZOFRAN  ODT) 4 MG disintegrating tablet Take 1 tablet (4 mg total) by mouth every 8 (eight) hours as needed for nausea or vomiting. 01/20/16   Garrick Charleston, MD  tamsulosin  (FLOMAX ) 0.4 MG CAPS capsule Take 1 capsule (0.4 mg total) by mouth daily. 01/20/16   Garrick Charleston, MD    Allergies: Patient has no known allergies.    Review of Systems  HENT:  Positive for ear pain and facial swelling.   Cardiovascular:  Positive for chest pain and palpitations.  Musculoskeletal:  Positive for neck pain.    Updated Vital Signs BP (!) 169/87   Pulse 63   Temp 98.1 F (36.7 C) (Oral)   Resp 16   Ht 5' 2 (1.575 m)   Wt 57.2 kg   SpO2 99%   BMI 23.05 kg/m   Physical Exam Vitals and nursing note reviewed.  Constitutional:      Appearance: Normal appearance.  HENT:     Head: Normocephalic and atraumatic.     Mouth/Throat:     Mouth: Mucous membranes are moist.  Eyes:     General: No scleral icterus.       Right eye: No discharge.        Left eye: No discharge.     Conjunctiva/sclera: Conjunctivae normal.  Cardiovascular:     Rate and Rhythm:  Normal rate and regular rhythm.     Pulses: Normal pulses.  Pulmonary:     Effort: Pulmonary effort is normal.     Breath sounds: Normal breath sounds.  Abdominal:     General: There is no distension.     Tenderness: There is no abdominal tenderness.  Musculoskeletal:        General: No deformity.     Cervical back: Normal range of motion.  Skin:    General: Skin is warm and dry.     Capillary Refill: Capillary refill takes less than 2 seconds.  Neurological:     Mental Status: She is alert.     Motor: No weakness.     Comments: Patient speaks in full goal oriented sentences. Cranial nerves 3-12 grossly intact. DTRs normal and symmetric. Equal grip strength bilateral with 5/5 strength against resistance in upper and lower extremities. No sensory  or motor deficits appreciated.   Psychiatric:        Mood and Affect: Mood normal.     (all labs ordered are listed, but only abnormal results are displayed) Labs Reviewed  CBC WITH DIFFERENTIAL/PLATELET - Abnormal; Notable for the following components:      Result Value   WBC 10.7 (*)    All other components within normal limits  BASIC METABOLIC PANEL WITH GFR - Abnormal; Notable for the following components:   Potassium 3.2 (*)    Glucose, Bld 105 (*)    All other components within normal limits  TROPONIN T, HIGH SENSITIVITY  TROPONIN T, HIGH SENSITIVITY    EKG: None  Radiology: DG Chest 2 View Result Date: 05/28/2024 CLINICAL DATA:  Intermittent chest pain for several weeks, facial numbness EXAM: CHEST - 2 VIEW COMPARISON:  09/06/2022 FINDINGS: Frontal and lateral views of the chest demonstrate an unremarkable cardiac silhouette. No acute airspace disease, effusion, or pneumothorax. No acute bony abnormalities. IMPRESSION: 1. No acute intrathoracic process. Electronically Signed   By: Ozell Daring M.D.   On: 05/28/2024 17:42   CT Head Wo Contrast Result Date: 05/28/2024 CLINICAL DATA:  Headache, hypertension EXAM: CT HEAD WITHOUT CONTRAST TECHNIQUE: Contiguous axial images were obtained from the base of the skull through the vertex without intravenous contrast. RADIATION DOSE REDUCTION: This exam was performed according to the departmental dose-optimization program which includes automated exposure control, adjustment of the mA and/or kV according to patient size and/or use of iterative reconstruction technique. COMPARISON:  None Available. FINDINGS: Brain: No acute infarct or hemorrhage. Lateral ventricles and midline structures appear unremarkable. No acute extra-axial fluid collections. No mass effect. Vascular: No hyperdense vessel or unexpected calcification. Skull: Normal. Negative for fracture or focal lesion. Sinuses/Orbits: No acute finding. Other: None. IMPRESSION: 1. No  acute intracranial process. Electronically Signed   By: Ozell Daring M.D.   On: 05/28/2024 17:38    Procedures   Medications Ordered in the ED  diazepam  (VALIUM ) injection 2.5 mg (2.5 mg Intravenous Patient Refused/Not Given 05/28/24 1728)  lidocaine  (LIDODERM ) 5 % 1 patch (has no administration in time range)                                 Medical Decision Making Amount and/or Complexity of Data Reviewed Labs: ordered. Radiology: ordered.  Risk Prescription drug management.   This patient presents to the ED for concern of chest pain, headache, neck pain, this involves an extensive number of treatment options, and is a complaint that  carries with it a high risk of complications and morbidity.  Differential diagnosis includes: ACS, pneumonia, PE, asthma exacerbation, stroke, TIA, musculoskeletal strain, torticollis, muscle spasm, radiculopathy, trigeminal neuralgia  Co morbidities:  asthma, dyslipidemia  Additional history: Patient was initially evaluated by her primary care, [facility] but was unable to seek follow-up appointment today due to availability.  Lab Tests:  I Ordered, and personally interpreted labs.  The pertinent results include: No acute abnormalities to explain patient symptoms  Imaging Studies:  I ordered imaging studies including CT head, chest x-ray I independently visualized and interpreted imaging which showed no acute cardiopulmonary or intracranial abnormality I agree with the radiologist interpretation  Cardiac Monitoring/ECG:  The patient was maintained on a cardiac monitor.  I personally viewed and interpreted the cardiac monitored which showed an underlying rhythm of: Sinus rhythm  Medicines ordered and prescription drug management:  I ordered medication including  Medications  diazepam  (VALIUM ) injection 2.5 mg (2.5 mg Intravenous Patient Refused/Not Given 05/28/24 1728)  lidocaine  (LIDODERM ) 5 % 1 patch (has no administration in time  range)   for muscle spasm Reevaluation of the patient after these medicines showed that the patient improved I have reviewed the patients home medicines and have made adjustments as needed  Test Considered:   none  Critical Interventions:   none  Consultations Obtained: None  Problem List / ED Course:     ICD-10-CM   1. Musculoskeletal pain of upper extremity, left  M79.602       MDM: 54 year old female who presents emergency department for evaluation of multiple complaints.  She does report intermittent chest pain, so I ordered the ED chest pain workup.  Initial troponin is less than 15.  No indication for delta troponin given length of patient's symptoms.  Chest x-ray shows no acute process to suggest pneumonia or asthma exacerbation.  Patient declines shortness of breath, vital signs are stable and no recent surgery or trauma that would suggest PE as the cause of patient's symptoms.  I also did order a CT of patient's head given her longstanding headache and elevated blood pressure.  CT shows no acute intracranial abnormality.  I do suspect patient's symptoms are likely musculoskeletal in nature and likely secondary to a tight or pulled muscle due to stress.  I did order her 2.5 of Valium  to treat her muscle spasms.  However, patient declined this medication.  Therefore I gave her a lidocaine  patch for pain management in the ED.  Upon reassessment, patient is significantly reassured that there were no acute abnormalities such as heart attack or stroke found on her workup.  I did explain to her that I think her symptoms are likely secondary due to a muscle spasm and that was the rationale for ordering the Valium .  Patient verbalized her understanding to this, and is agreeable to trying p.o. Valium  outpatient.  I am also sending her home with lidocaine  patches to use as needed throughout the day for continued pain.  I provided the patient with some educational resources on how to reduce the  spasms in her neck.  Patient does have an appointment to follow-up with her PCP on Thursday, which I recommended she keep.  Patient's vital signs are stable.  Patient is appropriate for discharge at this time.   Dispostion:  After consideration of the diagnostic results and the patients response to treatment, I feel that the patient would benefit from supportive care.   Final diagnoses:  Musculoskeletal pain of upper extremity, left  ED Discharge Orders          Ordered    lidocaine  (LIDODERM ) 5 %  Every 24 hours        05/28/24 1826    diazepam  (VALIUM ) 5 MG tablet  As needed        05/28/24 1826               Torrence Marry RAMAN, PA-C 05/28/24 1834  "

## 2024-05-28 NOTE — Discharge Instructions (Signed)
 It was a pleasure taking care of you today. You were seen in the Emergency Department for evaluation of neck, chest and shoulder pain. Your work-up was reassuring. Your CT/Xray/Labs showed no acute abnormality that would explain your symptoms.  As we discussed, I do suspect this is likely musculoskeletal in nature.  Therefore I am sending you home with 2 different medications.  1 is a lidocaine  patch which you can apply to the area of pain as needed.  Additionally, I am sending you home with a medication called Valium  for muscle spasms.  You will take half a tablet as needed for pain and spasms.  I recommend exercising caution with driving and operating heavy machinery as it may cause drowsiness. Refer to the attached documentation for further management of your symptoms. Follow up with your PCP as planned next week.    Please return to the ER if you experience chest pain, trouble breathing, intractable nausea/vomiting or any other life threatening illnesses.

## 2024-05-28 NOTE — ED Notes (Signed)
 Patient transported to CT

## 2024-08-05 ENCOUNTER — Encounter
